# Patient Record
Sex: Male | Born: 1960 | Race: White | Hispanic: No | Smoking: Current every day smoker
Health system: Southern US, Community
[De-identification: ages and names within clinical notes are randomized; demographics above are authoritative.]

## PROBLEM LIST (undated history)

## (undated) DIAGNOSIS — I83003 Varicose veins of unspecified lower extremity with ulcer of ankle: Secondary | ICD-10-CM

## (undated) DIAGNOSIS — G43909 Migraine, unspecified, not intractable, without status migrainosus: Secondary | ICD-10-CM

## (undated) DIAGNOSIS — L97309 Non-pressure chronic ulcer of unspecified ankle with unspecified severity: Secondary | ICD-10-CM

---

## 2009-03-12 ENCOUNTER — Emergency Department (HOSPITAL_COMMUNITY): Admission: EM | Admit: 2009-03-12 | Discharge: 2009-03-12 | Payer: Self-pay | Admitting: Emergency Medicine

## 2010-08-20 LAB — POCT I-STAT, CHEM 8
Calcium, Ion: 1.23 mmol/L (ref 1.12–1.32)
Chloride: 105 mEq/L (ref 96–112)
Creatinine, Ser: 0.9 mg/dL (ref 0.4–1.5)
Glucose, Bld: 90 mg/dL (ref 70–99)
HCT: 42 % (ref 39.0–52.0)
Potassium: 4 mEq/L (ref 3.5–5.1)
Sodium: 141 mEq/L (ref 135–145)

## 2012-05-17 DIAGNOSIS — I83003 Varicose veins of unspecified lower extremity with ulcer of ankle: Secondary | ICD-10-CM

## 2012-05-17 DIAGNOSIS — L97309 Non-pressure chronic ulcer of unspecified ankle with unspecified severity: Secondary | ICD-10-CM

## 2012-05-17 HISTORY — DX: Varicose veins of unspecified lower extremity with ulcer of ankle: I83.003

## 2012-05-17 HISTORY — DX: Varicose veins of unspecified lower extremity with ulcer of ankle: L97.309

## 2013-10-29 ENCOUNTER — Other Ambulatory Visit: Payer: Self-pay | Admitting: Family Medicine

## 2013-10-29 DIAGNOSIS — L97329 Non-pressure chronic ulcer of left ankle with unspecified severity: Secondary | ICD-10-CM

## 2013-11-01 ENCOUNTER — Other Ambulatory Visit: Payer: Self-pay

## 2013-11-02 ENCOUNTER — Ambulatory Visit
Admission: RE | Admit: 2013-11-02 | Discharge: 2013-11-02 | Disposition: A | Discharge status: Home / Self Care | Payer: BC Managed Care – PPO | Source: Ambulatory Visit | Attending: Family Medicine | Admitting: Family Medicine

## 2013-11-02 DIAGNOSIS — L97329 Non-pressure chronic ulcer of left ankle with unspecified severity: Secondary | ICD-10-CM

## 2014-10-09 ENCOUNTER — Encounter (HOSPITAL_BASED_OUTPATIENT_CLINIC_OR_DEPARTMENT_OTHER): Payer: BLUE CROSS/BLUE SHIELD | Attending: Surgery

## 2014-10-09 DIAGNOSIS — Z87891 Personal history of nicotine dependence: Secondary | ICD-10-CM | POA: Diagnosis not present

## 2014-10-09 DIAGNOSIS — L97321 Non-pressure chronic ulcer of left ankle limited to breakdown of skin: Secondary | ICD-10-CM | POA: Diagnosis present

## 2014-10-09 DIAGNOSIS — I83223 Varicose veins of left lower extremity with both ulcer of ankle and inflammation: Secondary | ICD-10-CM | POA: Diagnosis present

## 2014-10-09 DIAGNOSIS — F1021 Alcohol dependence, in remission: Secondary | ICD-10-CM | POA: Insufficient documentation

## 2014-10-10 ENCOUNTER — Other Ambulatory Visit: Payer: Self-pay | Admitting: Surgery

## 2014-10-10 DIAGNOSIS — L97322 Non-pressure chronic ulcer of left ankle with fat layer exposed: Secondary | ICD-10-CM

## 2014-10-10 DIAGNOSIS — I83223 Varicose veins of left lower extremity with both ulcer of ankle and inflammation: Secondary | ICD-10-CM

## 2014-10-10 DIAGNOSIS — L97329 Non-pressure chronic ulcer of left ankle with unspecified severity: Principal | ICD-10-CM

## 2014-10-11 ENCOUNTER — Other Ambulatory Visit: Payer: Self-pay | Admitting: Surgery

## 2014-10-11 DIAGNOSIS — L97322 Non-pressure chronic ulcer of left ankle with fat layer exposed: Secondary | ICD-10-CM

## 2014-10-11 DIAGNOSIS — I872 Venous insufficiency (chronic) (peripheral): Secondary | ICD-10-CM

## 2014-10-11 DIAGNOSIS — L97329 Non-pressure chronic ulcer of left ankle with unspecified severity: Principal | ICD-10-CM

## 2014-10-11 DIAGNOSIS — I83223 Varicose veins of left lower extremity with both ulcer of ankle and inflammation: Secondary | ICD-10-CM

## 2014-10-15 ENCOUNTER — Other Ambulatory Visit: Payer: Self-pay | Admitting: Surgery

## 2014-10-15 ENCOUNTER — Ambulatory Visit
Admission: RE | Admit: 2014-10-15 | Discharge: 2014-10-15 | Disposition: A | Discharge status: Home / Self Care | Payer: BLUE CROSS/BLUE SHIELD | Source: Ambulatory Visit | Attending: Surgery | Admitting: Surgery

## 2014-10-15 DIAGNOSIS — I83223 Varicose veins of left lower extremity with both ulcer of ankle and inflammation: Secondary | ICD-10-CM

## 2014-10-15 DIAGNOSIS — I872 Venous insufficiency (chronic) (peripheral): Secondary | ICD-10-CM | POA: Insufficient documentation

## 2014-10-15 DIAGNOSIS — L97329 Non-pressure chronic ulcer of left ankle with unspecified severity: Principal | ICD-10-CM

## 2014-10-15 DIAGNOSIS — L97322 Non-pressure chronic ulcer of left ankle with fat layer exposed: Secondary | ICD-10-CM

## 2014-10-15 HISTORY — DX: Migraine, unspecified, not intractable, without status migrainosus: G43.909

## 2014-10-15 HISTORY — DX: Non-pressure chronic ulcer of unspecified ankle with unspecified severity: L97.309

## 2014-10-15 HISTORY — DX: Varicose veins of unspecified lower extremity with ulcer of ankle: I83.003

## 2014-10-15 NOTE — Consult Note (Addendum)
Chief Complaint: Chief Complaint  Patient presents with  . Advice Only    Venous Statsis Ulcer    Referring Physician(s): Dr Evlyn Kanner  History of Present Illness: Roberto Pearson is a 54 y.o. male with a history of a left sided medial ankle ulcer, which has been present for 2 years. He states that recently he has started receiving wound therapy at the wound center of [facility], approximately one week before his presentation.  He reports that the wound has been present for 2 years and has not healed, as he has been treating it with conservative over-the-counter therapy until recently. He denies any leg achiness, swelling, restless legs, or pain. He does report that he has had excessive bleeding in the past at the site of the ulcer. He never had a previous ulcer prior to the onset of the current ulcer.  He denies symptoms of the right lower extremity, with no swelling, venous varicosities, ulceration, or skin changes. No leg achiness or heaviness.  He denies knowledge of a diagnosis of a prior DVT or superficial venous thrombus.  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  He has only initiated compression therapy recently with the onset of his wound care.  Past Medical History  Diagnosis Date  . Venous stasis ulcer of ankle 2014    Left  . Migraines     No past surgical history on file.  Allergies: Review of patient's allergies indicates no known allergies.  Medications: Prior to Admission medications   Medication Sig Start Date End Date Taking? Authorizing Provider  citalopram (CELEXA) 10 MG tablet Take 10 mg by mouth daily.   Yes Historical Provider, MD  indomethacin (INDOCIN) 50 MG capsule Take 50 mg by mouth 2 (two) times daily with a meal.   Yes Historical Provider, MD  SUMAtriptan  (IMITREX) 100 MG tablet Take 100 mg by mouth every 2 (two) hours as needed for migraine. May repeat in 2 hours if headache persists or recurs.   Yes Historical Provider, MD     No family history on file.  History   Social History  . Marital Status: Legally Separated    Spouse Name: N/A  . Number of Children: N/A  . Years of Education: N/A   Social History Main Topics  . Smoking status: Current Every Day Smoker    Types: E-cigarettes  . Smokeless tobacco: Never Used  . Alcohol Use: No  . Drug Use: No  . Sexual Activity: Not on file   Other Topics Concern  . None   Social History Narrative  . None     Review of Systems: A 12 point ROS discussed and pertinent positives are indicated in the HPI above.  All other systems are negative.  Review of Systems  Vital Signs: BP 137/77 mmHg  Pulse 78  Temp(Src) 97.8 F (36.6 C) (Oral)  Resp 15  Ht  (1.778 m)  Wt 215 lb (97.523 kg)  BMI 30.85 kg/m2  SpO2 97%  Physical Exam   Atraumatic, normocephalic, mucous membranes moist and pink. Wearing glasses. Conjugate gaze. No scleral icterus or scleral injection. Neck is soft supple. No adenopathy. Chest is symmetric on inspiration and expiration with no labored breathing. Abdomen is soft nontender nondistended without muscle guarding or rigidity. Genitourinary deferred.  Moving all 4 extremities equally, with sensory is grossly intact. Patient is well kempt. Well-groomed. Appropriate questions and insight. Normal affect. Palpable bilateral dorsalis pedis pulses. Strong right-sided posterior tibial pulses. 3 cm venous ulcer overlying the medial malleolus of the left lower extremity. He has multiple telangiectasias in the Gaiter zone of the left lower extremity. Venous varicosities are present inferior to the wound, just above the heel pad.  Mallampati Score:  2  Imaging: [place] Venous Img Lower Unilateral Left  10/15/2014   CLINICAL DATA:  54 year old male with a history  of left lower extremity nonhealing ulcer. He has been referred for venous evaluation, for potential contributing venous insufficiency to ulcer.  EXAM: Left LOWER EXTREMITY VENOUS DOPPLER ULTRASOUND  TECHNIQUE: Gray-scale sonography with graded compression, as well as color Doppler and duplex ultrasound were performed to evaluate the lower extremity deep venous systems from the level of the common femoral vein and including the common femoral, femoral, profunda femoral, popliteal and calf veins including the posterior tibial, peroneal and gastrocnemius veins when visible. The superficial great saphenous vein was also interrogated. Spectral Doppler was utilized to evaluate flow at rest and with distal augmentation maneuvers in the common femoral, femoral and popliteal veins.  COMPARISON:  None.  FINDINGS: Contralateral Common Femoral Vein: Respiratory phasicity is normal and symmetric with the symptomatic side. No evidence of thrombus. Normal compressibility.  Common Femoral Vein: No evidence of thrombus. Normal compressibility, respiratory phasicity and response to augmentation.  Saphenofemoral Junction: No evidence of thrombus. Normal compressibility and flow on color Doppler imaging.  Profunda Femoral Vein: No evidence of thrombus. Normal compressibility and flow on color Doppler imaging.  Femoral Vein: No evidence of thrombus. Normal compressibility, respiratory phasicity and response to augmentation.  Popliteal Vein: No evidence of thrombus. Normal compressibility, respiratory phasicity and response to augmentation.  Calf Veins: No evidence of thrombus. Normal compressibility and flow on color Doppler imaging.  Superficial Great Saphenous Vein: No evidence of thrombus. Normal compressibility and flow on color Doppler imaging.  GSV at SFJ:  Diameter = 8mm, Reflux = 4.8 sec  GSV at Proximal Thigh: Diameter = 13mm, Reflux = 3.2 sec  GSV at Mid Thigh: Diameter = 9.269mm, Reflux = 3.3 sec  GSV at Distal Thigh: Diameter  = 9.540mm, Reflux = 2.7 sec  GSV at Prox Calf: Diameter = 7.716mm, Reflux = 2.8 sec  GSV at Mid Calf: Diameter = 5.144mm, Reflux = 1.6 sec  GSV at Distal Calf: Diameter = 5mm, Reflux = 0 sec  SSV at Sapheno popliteal junction: Diameter = 6mm, Reflux = 0 sec  SSV at mid calf: Diameter = 8mm, Reflux = 0 sec  SSF at distal calf: Diameter = 6mm, Reflux = 0 sec  Varicosities:  Several varicosities are present.  Network of varicose veins associated with the below knee great saphenous vein, proximal calf. Varicosity extends anteriorly towards the knee, as well as posteriorly towards the posterior thigh. There is associated perforator vein at the posterior medial left thigh associated with this varicose system.  Inferior network of varicose veins associated with the below knee great saphenous vein, distal calf. These varicosities extend posteriorly coursing towards the venous ulcer of the medial left ankle.  Other Findings:  None.  IMPRESSION: Sonographic survey of the left lower extremity negative for DVT.  Venous reflux (as severe as grade 3) involving the length of the left great saphenous vein from the saphenous femoral junction to the distal calf, with system of varicose veins contributing to the ulcer  at the medial left ankle.  Signed,  Yvone Neu. Loreta Ave, DO  [facility]  [facility]   Electronically Signed   By: Gilmer Mor D.O.   On: 10/15/2014 15:58    Labs:  CBC: No results for input(s): WBC, HGB, HCT, PLT in the last 8760 hours.  COAGS: No results for input(s): INR, APTT in the last 8760 hours.  BMP: No results for input(s): NA, K, CL, CO2, GLUCOSE, BUN, CALCIUM, CREATININE, GFRNONAA, GFRAA in the last 8760 hours.  Invalid input(s): CMP  LIVER FUNCTION TESTS: No results for input(s): BILITOT, AST, ALT, ALKPHOS, PROT, ALBUMIN in the last 8760 hours.  TUMOR MARKERS: No results for input(s): AFPTM, CEA, CA199, CHROMGRNA in the last 8760  hours.  Assessment and Plan:  Mr. Umbach is a 54 year old gentleman with CEAP 6 category venous insufficiency of his left lower extremity contributing to a nonhealing wound of approximately 2 years overlying the left medial malleolus. Directed duplex on today's examination demonstrates reflux along the length of the left great saphenous vein.  I believe Mr. Brainerd is a candidate for endovenous ablation of the left great saphenous vein to assist with healing of his venous ulcer, and I had an extensive conversation with Mr. Boutwell regarding venous disease. I discussed the natural history, pathology, pathophysiology, and potential treatment options for venous insufficiency. I had a complete informed consent regarding endovenous ablation, including risks and benefits. Specific risks that I spoke of included bleeding, infection, injury to local structures including superficial nerves, skin changes, DVT, need for further treatment/surgery/intervention, cardiopulmonary collapse, death. I answered all of his questions to the best of my ability.  I believe the best treatment would include endovenous ablation of the length of the great saphenous vein, with access near the medial malleolus where the great saphenous vein remains at least 3 mm in diameter. There is a system of varicosities associated with the wound, which should thrombosis after treatment of the great saphenous vein. If there is persisting varicosities after GSP treatment, targeted sclerotherapy may be an option.  There is a second network of venous varicosities at the proximal medial calf, which have a high likelihood of thrombosing after treatment of the great saphenous vein. Again, if this network does not resolve after endovenous ablation, directed sclerotherapy may be an option.  Mr. Beckerman wishes to proceed with the treatment at the earliest time. We can schedule him at his convenience.  Would recommend continuing wound care at this time, as well as  compression stockings if a UNA boot is not applied.  Thank you for this interesting consult.  I greatly enjoyed meeting Jashun Puertas Liwanag and look forward to participating in his care.  SignedGilmer Mor 10/15/2014, 5:52 PM   I spent a total of  45 Minutes   in face to face in clinical consultation, greater than 50% of which was counseling/coordinating care for chronic venous insufficiency contributing to nonhealing venous wound of the left lower extremity, and endovenous ablation for treatment.   ADDENDUM: 10/16/2014  Mr. Sidell has been treating his lower extremity symptoms for years with compression stockings, wearing these during the day hours.    He has been treating his left ankle venous ulcer with conservative, over the counter wound care for 2 years, in addition to compression stockings.  He initiated his [facility] treatment 1 week before his current visit.  Over the past week, he has been using compression wrap and silver-infused products.    Signed,  Yvone Neu. Loreta Ave, DO

## 2014-10-16 ENCOUNTER — Encounter (HOSPITAL_BASED_OUTPATIENT_CLINIC_OR_DEPARTMENT_OTHER): Payer: BLUE CROSS/BLUE SHIELD | Attending: Surgery

## 2014-10-16 DIAGNOSIS — I872 Venous insufficiency (chronic) (peripheral): Secondary | ICD-10-CM | POA: Insufficient documentation

## 2014-10-16 DIAGNOSIS — Z87891 Personal history of nicotine dependence: Secondary | ICD-10-CM | POA: Diagnosis not present

## 2014-10-16 DIAGNOSIS — L97321 Non-pressure chronic ulcer of left ankle limited to breakdown of skin: Secondary | ICD-10-CM | POA: Diagnosis not present

## 2014-10-23 DIAGNOSIS — I872 Venous insufficiency (chronic) (peripheral): Secondary | ICD-10-CM | POA: Diagnosis not present

## 2014-10-30 DIAGNOSIS — I872 Venous insufficiency (chronic) (peripheral): Secondary | ICD-10-CM | POA: Diagnosis not present

## 2014-11-06 DIAGNOSIS — I872 Venous insufficiency (chronic) (peripheral): Secondary | ICD-10-CM | POA: Diagnosis not present

## 2014-11-14 ENCOUNTER — Other Ambulatory Visit: Payer: Self-pay | Admitting: Interventional Radiology

## 2014-11-14 ENCOUNTER — Other Ambulatory Visit: Payer: Self-pay | Admitting: Surgery

## 2014-11-14 DIAGNOSIS — L97329 Non-pressure chronic ulcer of left ankle with unspecified severity: Secondary | ICD-10-CM

## 2014-11-14 DIAGNOSIS — I83223 Varicose veins of left lower extremity with both ulcer of ankle and inflammation: Secondary | ICD-10-CM

## 2014-11-14 DIAGNOSIS — L97322 Non-pressure chronic ulcer of left ankle with fat layer exposed: Secondary | ICD-10-CM

## 2014-11-14 DIAGNOSIS — I872 Venous insufficiency (chronic) (peripheral): Secondary | ICD-10-CM

## 2014-12-12 ENCOUNTER — Other Ambulatory Visit (HOSPITAL_COMMUNITY): Payer: Self-pay | Admitting: Interventional Radiology

## 2014-12-12 ENCOUNTER — Ambulatory Visit
Admission: RE | Admit: 2014-12-12 | Discharge: 2014-12-12 | Disposition: A | Discharge status: Home / Self Care | Payer: BLUE CROSS/BLUE SHIELD | Source: Ambulatory Visit | Attending: Interventional Radiology | Admitting: Interventional Radiology

## 2014-12-12 ENCOUNTER — Other Ambulatory Visit: Payer: Self-pay | Admitting: Interventional Radiology

## 2014-12-12 DIAGNOSIS — I83223 Varicose veins of left lower extremity with both ulcer of ankle and inflammation: Secondary | ICD-10-CM

## 2014-12-12 DIAGNOSIS — I872 Venous insufficiency (chronic) (peripheral): Secondary | ICD-10-CM

## 2014-12-12 DIAGNOSIS — L97329 Non-pressure chronic ulcer of left ankle with unspecified severity: Secondary | ICD-10-CM

## 2014-12-12 DIAGNOSIS — L97322 Non-pressure chronic ulcer of left ankle with fat layer exposed: Secondary | ICD-10-CM

## 2014-12-31 ENCOUNTER — Other Ambulatory Visit: Payer: BLUE CROSS/BLUE SHIELD

## 2015-01-02 ENCOUNTER — Other Ambulatory Visit (HOSPITAL_COMMUNITY): Payer: Self-pay | Admitting: Interventional Radiology

## 2015-01-02 ENCOUNTER — Ambulatory Visit: Admission: RE | Admit: 2015-01-02 | Payer: BLUE CROSS/BLUE SHIELD | Source: Ambulatory Visit

## 2015-01-02 ENCOUNTER — Encounter: Payer: Self-pay | Admitting: Radiology

## 2015-01-02 ENCOUNTER — Ambulatory Visit
Admission: RE | Admit: 2015-01-02 | Discharge: 2015-01-02 | Disposition: A | Discharge status: Home / Self Care | Payer: BLUE CROSS/BLUE SHIELD | Source: Ambulatory Visit | Attending: Interventional Radiology | Admitting: Interventional Radiology

## 2015-01-02 DIAGNOSIS — I872 Venous insufficiency (chronic) (peripheral): Secondary | ICD-10-CM

## 2015-01-02 DIAGNOSIS — L97322 Non-pressure chronic ulcer of left ankle with fat layer exposed: Secondary | ICD-10-CM

## 2015-01-02 DIAGNOSIS — L97329 Non-pressure chronic ulcer of left ankle with unspecified severity: Secondary | ICD-10-CM

## 2015-01-02 DIAGNOSIS — I83223 Varicose veins of left lower extremity with both ulcer of ankle and inflammation: Secondary | ICD-10-CM

## 2015-01-09 ENCOUNTER — Encounter: Payer: Self-pay | Admitting: Radiology

## 2015-01-09 ENCOUNTER — Ambulatory Visit
Admission: RE | Admit: 2015-01-09 | Discharge: 2015-01-09 | Disposition: A | Discharge status: Home / Self Care | Payer: BLUE CROSS/BLUE SHIELD | Source: Ambulatory Visit | Attending: Interventional Radiology | Admitting: Interventional Radiology

## 2015-01-09 DIAGNOSIS — I83223 Varicose veins of left lower extremity with both ulcer of ankle and inflammation: Secondary | ICD-10-CM

## 2015-01-09 DIAGNOSIS — L97329 Non-pressure chronic ulcer of left ankle with unspecified severity: Principal | ICD-10-CM

## 2015-01-09 DIAGNOSIS — L97322 Non-pressure chronic ulcer of left ankle with fat layer exposed: Secondary | ICD-10-CM

## 2015-01-09 DIAGNOSIS — I872 Venous insufficiency (chronic) (peripheral): Secondary | ICD-10-CM

## 2015-01-09 NOTE — Progress Notes (Signed)
Patient ID: Roberto Pearson, male   DOB: 1961/05/17, 54 y.o.   MRN: [id number]       Chief Complaint: Venous insufficiency left lower extremity, leg pain and edema  Referring Physician(s): Sanye Ledesma  History of Present Illness: Roberto Pearson is a 54 y.o. male one-week status post left GSV transcatheter laser occlusion. He has recovered at home very well. He has been compliant with compression stockings and daily walking. No interval fevers. No signs of DVT or phlebitis. Left medial ankle wound is improving. Overall he is doing very well. He states he is ready to go back to work.  Past Medical History  Diagnosis Date  . Venous stasis ulcer of ankle 2014    Left  . Migraines     No past surgical history on file.  Allergies: Review of patient's allergies indicates no known allergies.  Medications: Prior to Admission medications   Medication Sig Start Date End Date Taking? Authorizing Provider  citalopram (CELEXA) 10 MG tablet Take 10 mg by mouth daily.    Historical Provider, MD  indomethacin (INDOCIN) 50 MG capsule Take 50 mg by mouth 2 (two) times daily with a meal.    Historical Provider, MD  naproxen (NAPROSYN) 250 MG tablet Take by mouth 2 (two) times daily as needed.    Historical Provider, MD  SUMAtriptan (IMITREX) 100 MG tablet Take 100 mg by mouth every 2 (two) hours as needed for migraine. May repeat in 2 hours if headache persists or recurs.    Historical Provider, MD     No family history on file.  Social History   Social History  . Marital Status: Legally Separated    Spouse Name: N/A  . Number of Children: N/A  . Years of Education: N/A   Social History Main Topics  . Smoking status: Current Every Day Smoker    Types: E-cigarettes  . Smokeless tobacco: Never Used  . Alcohol Use: No  . Drug Use: No  . Sexual Activity: Not on file   Other Topics Concern  . Not on file   Social History Narrative  . No narrative on file     Review of Systems: A 12  point ROS discussed and pertinent positives are indicated in the HPI above.  All other systems are negative.  Review of Systems  Constitutional: Negative for fever and chills.  Respiratory: Negative for cough.   Cardiovascular: Negative for chest pain.  Gastrointestinal: Negative for abdominal pain.  Skin: Positive for color change.    Vital Signs: There were no vitals taken for this visit.  Physical Exam  Constitutional: He appears well-developed and well-nourished. No distress.  Musculoskeletal: Normal range of motion. He exhibits no edema or tenderness.  Left lower extremity entry site is well-healed. Medial ankle wound is improving. No peripheral edema. No signs of phlebitis or infection. Overall he is doing very well.  Skin: He is not diaphoretic.     Imaging: Ir Embo Venous Not Hemorr Hemang  Inc Guide Roadmapping  01/02/2015   CLINICAL DATA:  Symptomatic lower extremity varicose veins.  EXAM: TRANSCATHETER LASER OCCLUSION left GREATER SAPHENOUS VEIN WITH ULTRASOUND GUIDANCE:  TECHNIQUE: Survey ultrasound of the leg was performed and the course of the greater saphenous vein was marked on the skin. Overlying skin prepped with Betadine, draped in usual sterile fashion. After local anesthetic using 1% lidocaine, the greater saphenous vein was accessed at the location level under ultrasound with a 21-gauge micropuncture needle. A 018 guidewire advanced easily. This  was exchanged using a transitional dilator for a 035 J wire. Over this, the 6 French delivery sheath was advanced beyond the saphenofemoral junction. The laser fiber was advanced and positioned 2.0 cm from the saphenofemoral junction. Tumescent dilute 0.1% lidocaine used along the planned treatment length. Ultrasound was used to confirm appropriate tumescent anesthesia and to verify that all segments were at least 1 cm from the skin surface. The laser fiber was activated while being withdrawn over the treatment length,  delivering 2,308joules over 289seconds. The catheter and sheath were removed and hemostasis easily achieved at the site. Patient was placed in graduated compression stockings and ambulated for 20 minutes without difficulty. Patient tolerated the procedure well, with no immediate complication.  COMPLICATIONS: None  IMPRESSION: 1. Technically successful transcatheter laser occlusion of left greater saphenous vein. Patient will followup in clinic in one week. Patient knows to telephone should there be any interval questions or problems.   Electronically Signed   By: Jolaine Click M.D.   On: 01/02/2015 15:20     Assessment and Plan:  One-week status post left GSV transcatheter laser occlusion. Patient has recovered at home very well. No interval fevers or pain. Entry site well-healed. Medial ankle wound is improving.  he has been compliant with compression stockings and daily walking  Plan: Follow-up outpatient visit with ultrasound in one month.  Thank you for this interesting consult.  I greatly enjoyed meeting Roberto Pearson and look forward to participating in their care.  A copy of this report was sent to the requesting provider on this date.  SignedBerdine Dance 01/09/2015, 2:31 PM   I spent a total of    15 Minutes in face to face in clinical consultation, greater than 50% of which was counseling/coordinating care for this patient status post left GSV transcatheter laser occlusion for venous insufficiency.

## 2015-02-05 ENCOUNTER — Ambulatory Visit
Admission: RE | Admit: 2015-02-05 | Discharge: 2015-02-05 | Disposition: A | Discharge status: Home / Self Care | Payer: BLUE CROSS/BLUE SHIELD | Source: Ambulatory Visit | Attending: Interventional Radiology | Admitting: Interventional Radiology

## 2015-02-05 DIAGNOSIS — L97329 Non-pressure chronic ulcer of left ankle with unspecified severity: Principal | ICD-10-CM

## 2015-02-05 DIAGNOSIS — L97322 Non-pressure chronic ulcer of left ankle with fat layer exposed: Secondary | ICD-10-CM

## 2015-02-05 DIAGNOSIS — I83223 Varicose veins of left lower extremity with both ulcer of ankle and inflammation: Secondary | ICD-10-CM

## 2015-02-05 DIAGNOSIS — I872 Venous insufficiency (chronic) (peripheral): Secondary | ICD-10-CM

## 2015-02-06 ENCOUNTER — Other Ambulatory Visit: Payer: BLUE CROSS/BLUE SHIELD

## 2015-02-06 ENCOUNTER — Ambulatory Visit: Payer: BLUE CROSS/BLUE SHIELD

## 2015-09-18 ENCOUNTER — Other Ambulatory Visit (HOSPITAL_COMMUNITY): Payer: Self-pay | Admitting: Interventional Radiology

## 2015-09-18 DIAGNOSIS — I872 Venous insufficiency (chronic) (peripheral): Secondary | ICD-10-CM

## 2015-09-18 DIAGNOSIS — L97322 Non-pressure chronic ulcer of left ankle with fat layer exposed: Secondary | ICD-10-CM

## 2015-09-18 DIAGNOSIS — I83223 Varicose veins of left lower extremity with both ulcer of ankle and inflammation: Secondary | ICD-10-CM

## 2015-09-18 DIAGNOSIS — L97329 Non-pressure chronic ulcer of left ankle with unspecified severity: Principal | ICD-10-CM

## 2016-06-18 ENCOUNTER — Ambulatory Visit (INDEPENDENT_AMBULATORY_CARE_PROVIDER_SITE_OTHER): Payer: BLUE CROSS/BLUE SHIELD | Admitting: Orthopedic Surgery

## 2016-06-18 ENCOUNTER — Encounter (INDEPENDENT_AMBULATORY_CARE_PROVIDER_SITE_OTHER): Payer: Self-pay | Admitting: Orthopedic Surgery

## 2016-06-18 ENCOUNTER — Telehealth (INDEPENDENT_AMBULATORY_CARE_PROVIDER_SITE_OTHER): Payer: Self-pay | Admitting: Orthopedic Surgery

## 2016-06-18 ENCOUNTER — Ambulatory Visit (INDEPENDENT_AMBULATORY_CARE_PROVIDER_SITE_OTHER): Payer: BLUE CROSS/BLUE SHIELD

## 2016-06-18 DIAGNOSIS — M25511 Pain in right shoulder: Principal | ICD-10-CM

## 2016-06-18 DIAGNOSIS — G8929 Other chronic pain: Secondary | ICD-10-CM | POA: Insufficient documentation

## 2016-06-18 MED ORDER — DICLOFENAC SODIUM 2 % TD SOLN: 2.0000 | Freq: Two times a day (BID) | TRANSDERMAL | 1 refills | Status: DC

## 2016-06-18 NOTE — Telephone Encounter (Signed)
Patient called and wanted to know if Dr. August Saucerean was going to prescribe an topical ointment for his shoulder or did he need to get something over the counter? YN#[phone]#[phone].  Thank you

## 2016-06-21 ENCOUNTER — Telehealth (INDEPENDENT_AMBULATORY_CARE_PROVIDER_SITE_OTHER): Payer: Self-pay | Admitting: Orthopedic Surgery

## 2016-06-21 ENCOUNTER — Other Ambulatory Visit (INDEPENDENT_AMBULATORY_CARE_PROVIDER_SITE_OTHER): Payer: Self-pay | Admitting: Radiology

## 2016-06-21 DIAGNOSIS — M25511 Pain in right shoulder: Secondary | ICD-10-CM

## 2016-06-21 DIAGNOSIS — G8929 Other chronic pain: Secondary | ICD-10-CM | POA: Diagnosis not present

## 2016-06-21 MED ORDER — LIDOCAINE HCL 1 % IJ SOLN
3.0000 mL | INTRAMUSCULAR | Status: AC | PRN
  Administered 2016-06-21: 3 mL

## 2016-06-21 MED ORDER — BUPIVACAINE HCL 0.25 % IJ SOLN
0.6600 mL | INTRAMUSCULAR | Status: AC | PRN
  Administered 2016-06-21: .66 mL via INTRA_ARTICULAR

## 2016-06-21 MED ORDER — DICLOFENAC SODIUM 2 % TD SOLN: 2.0000 | Freq: Two times a day (BID) | TRANSDERMAL | 1 refills | Status: DC

## 2016-06-21 MED ORDER — TRIAMCINOLONE ACETONIDE 40 MG/ML IJ SUSP
20.0000 mg | INTRAMUSCULAR | Status: AC | PRN
  Administered 2016-06-21: 20 mg via INTRA_ARTICULAR

## 2016-06-21 NOTE — Telephone Encounter (Signed)
IC pharm and advised cancel Rx, not sure how it went to them, IC patient and advised also again that it will come from mail order pharm, and they will be calling him

## 2016-06-21 NOTE — Progress Notes (Signed)
Office Visit Note   Patient: Roberto Pearson           Date of Birth: [birth year]           MRN: [id number] Visit Date: 06/18/2016 Requested by: Blair Heys, MD [address] [place], [place] [address] PCP: Thora Lance, MD  Subjective: Chief Complaint  Patient presents with  . Right Shoulder - Pain    HPI  [name] is a 56 year old patient with right shoulder pain.  He's had pain in the posterior shoulder joint.  Now reports some anterior shoulder pain.  Pain ranges from 2-7 on the pain scale.  Some activities of daily living are limited.  Patient has full range of motion but it is painful.  Last shot helped when it was placed into the subacromial space.  Today he localizes his pain primarily to the superior aspect of the shoulder which is in a different location was last time.              Review of Systems All systems reviewed are negative as they relate to the chief complaint within the history of present illness.  Patient denies  fevers or chills.    Assessment & Plan: Visit Diagnoses:  1. 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  Plan is Pennsaid has a topical along with an ultrasound-guided acromioclavicular joint injection today.  Continue with Mobic.  I will see him back as needed.  Follow-Up Instructions: Return if symptoms worsen or fail to improve.   Orders:  Orders Placed This Encounter  Procedures  . XR Shoulder Right   Meds ordered this encounter  Medications  . DISCONTD: Diclofenac Sodium (PENNSAID) 2 % SOLN    Sig: Place 2 Squirts onto the skin 2 (two) times daily.    Dispense:  1 Bottle    Refill:  1      Procedures: Medium Joint Inj Date/Time: 06/21/2016 4:01 PM Performed by: Cammy Copa Authorized by: Cammy Copa   Consent Given by:  Patient Site marked: the procedure site was marked   Timeout: prior to procedure the correct patient,  procedure, and site was verified   Indications:  Pain and diagnostic evaluation Location:  Shoulder Site:  R acromioclavicular Prep: patient was prepped and draped in usual sterile fashion   Needle Size:  25 G Needle Length:  1.5 inches Approach:  Superior Ultrasound Guided: Yes   Fluoroscopic Guidance: No   Medications:  3 mL lidocaine 1 %; 20 mg triamcinolone acetonide 40 MG/ML; 0.66 mL bupivacaine 0.25 % Aspiration Attempted: No   Patient tolerance:  Patient tolerated the procedure well with no immediate complications     Clinical Data: No additional findings.  Objective: Vital Signs: There were no vitals taken for this visit.  Physical Exam   Constitutional: Patient appears well-developed HEENT:  Head: Normocephalic Eyes:EOM are normal Neck: Normal range of motion Cardiovascular: Normal rate Pulmonary/chest: Effort normal Neurologic: Patient is alert Skin: Skin is warm Psychiatric: Patient has normal mood and affect   Ortho Exam orthopedic exam demonstrates full active and passive range of motion of the right shoulder.  Good cervical spine range of motion with normal motor sensory function in the right arm.  Negative antigen relocation testing positive pain with crossarm adduction on the right negative on the left.  Excellent rotator cuff strength isolated infraspinatus supraspinatus and subscap muscle testing.  No scapular dyskinesia with  forward flexion no other masses lymph adenopathy or skin changes noted in the shoulder girdle region  Specialty Comments:  No specialty comments available.  Imaging: No results found.   PMFS History: Patient Active Problem List   Diagnosis Date Noted  . Chronic right shoulder pain 06/18/2016  . Skin ulcer of left ankle (HCC)   . 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 Venous insufficiency    Past Medical History:  Diagnosis Date  . Migraines   . Venous stasis ulcer of  ankle (HCC) 2014   Left    No family history on file.  No past surgical history on file. Social History   Occupational History  . Not on file.   Social History Main Topics  . Smoking status: Current Every Day Smoker    Types: E-cigarettes  . Smokeless tobacco: Never Used     Comment: vapes  . Alcohol use No  . Drug use: No  . Sexual activity: Not on file

## 2016-06-21 NOTE — Telephone Encounter (Signed)
IC patient and advised per pharmacy script did not go through correctly, I have resent and they should be calling the patient.

## 2016-06-21 NOTE — Telephone Encounter (Signed)
PATIENT CALLED SAYING THAT [facility] IS REFUSING TO PAY FOR Rx. WONDERING IF WE [facility] CB # [phone]

## 2016-06-23 ENCOUNTER — Telehealth (INDEPENDENT_AMBULATORY_CARE_PROVIDER_SITE_OTHER): Payer: Self-pay | Admitting: Orthopedic Surgery

## 2016-06-23 MED ORDER — DICLOFENAC SODIUM 2 % TD SOLN: 2.0000 | Freq: Two times a day (BID) | TRANSDERMAL | 1 refills | Status: AC

## 2016-06-23 NOTE — Telephone Encounter (Signed)
I have resent Rx to them, IC and s/w Velvet, they did not have eprescribed one.  I will f/u with them in the morning- and then let pt know update.

## 2016-06-23 NOTE — Telephone Encounter (Signed)
Patient called and requested that you call him in regards to the spray you all are sending him, he has some questions.  ZO#[phone]#[phone].  Thank you.

## 2016-06-24 ENCOUNTER — Telehealth (INDEPENDENT_AMBULATORY_CARE_PROVIDER_SITE_OTHER): Payer: Self-pay | Admitting: Orthopedic Surgery

## 2016-06-24 NOTE — Telephone Encounter (Signed)
IC pt and advised [facility] and s/w AJ, he says patient should have Rx by mid next week.

## 2016-06-24 NOTE — Telephone Encounter (Signed)
Patient called and stated that the pharmacy does not have any prescriptions for him.  The pharmacy is [facility].  His Cb#[phone].

## 2016-07-22 IMAGING — US US EXTREM LOW VENOUS*L*
1 series · 13 of 24 positions shown · non-contrast
Comparison: None.

CLINICAL DATA: One week status post left GSV transcatheter laser
occlusion for venous insufficiency and chronic venous stasis



[Series 1: us extrem low venous*left* · 13 of 36 slices shown]
[im 1/36]
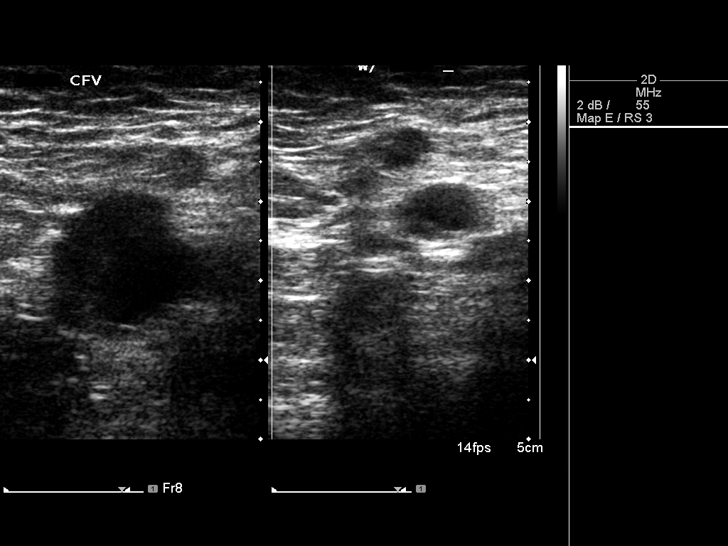
[im 4/36]
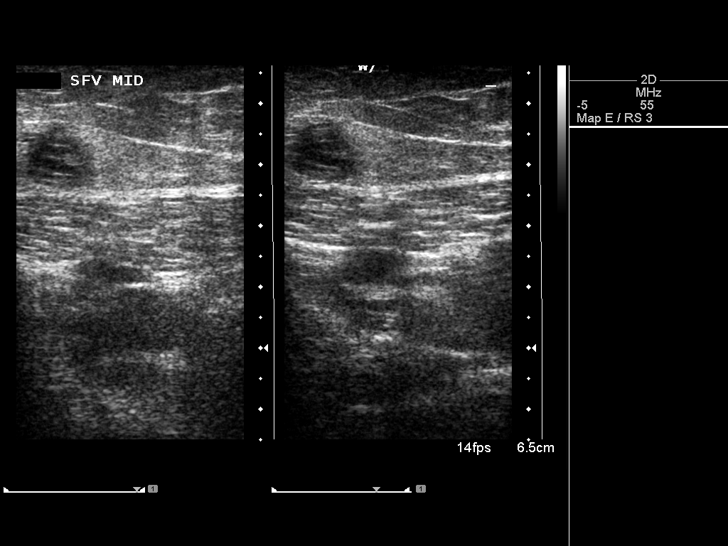
[im 7/36]
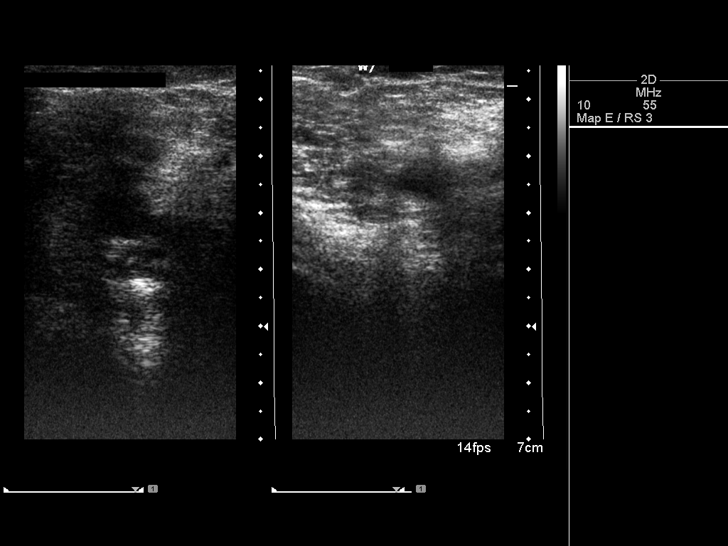
[im 10/36]
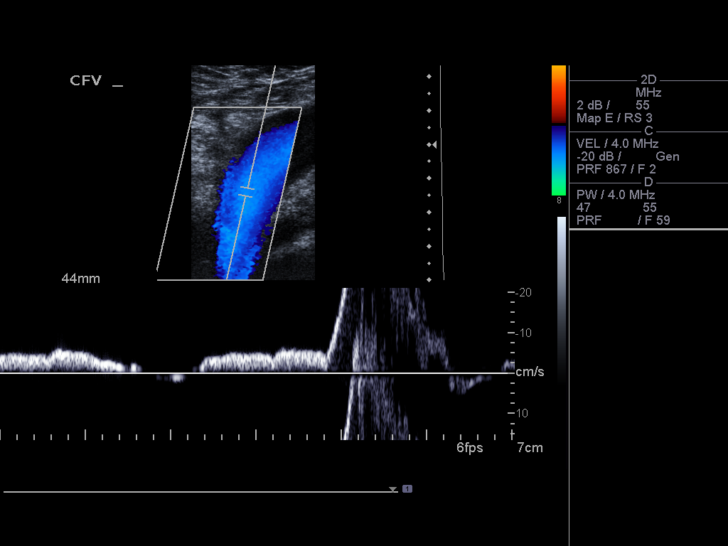
[im 13/36]
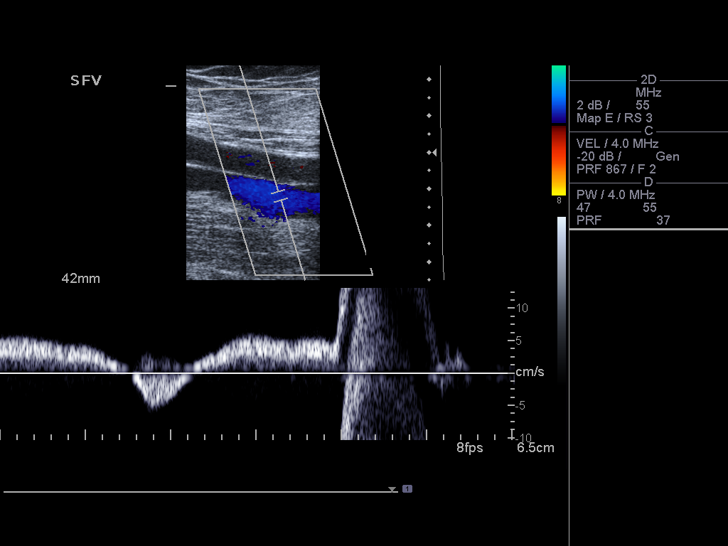
[im 16/36]
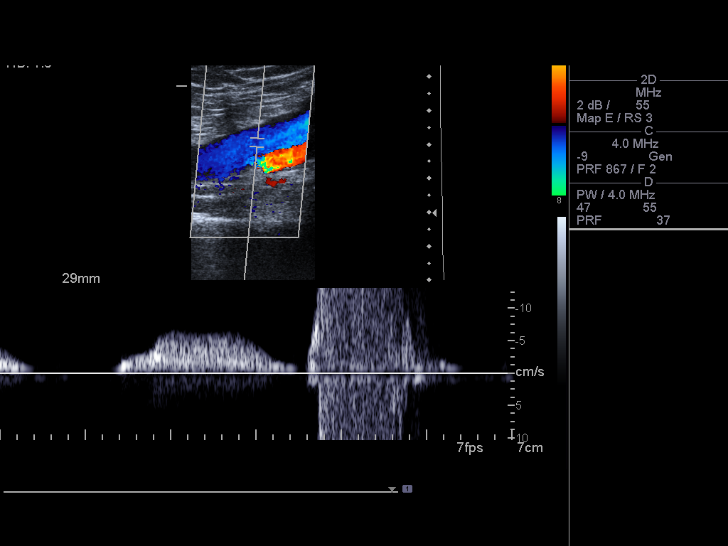
[im 19/36]
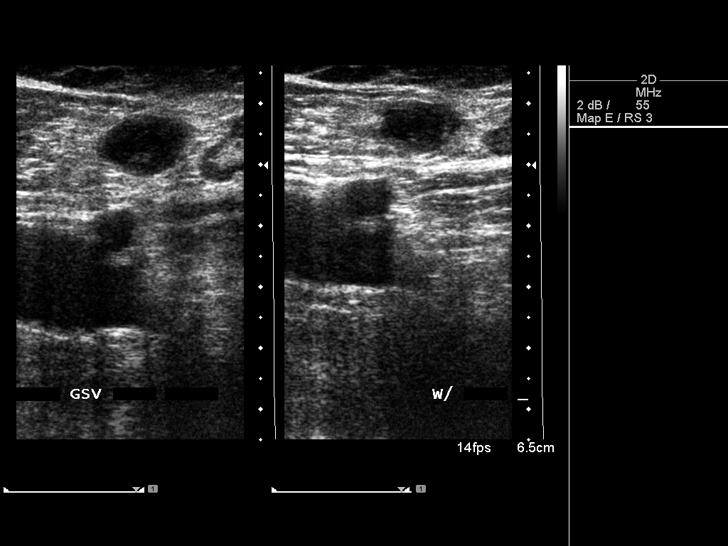
[im 20/36]
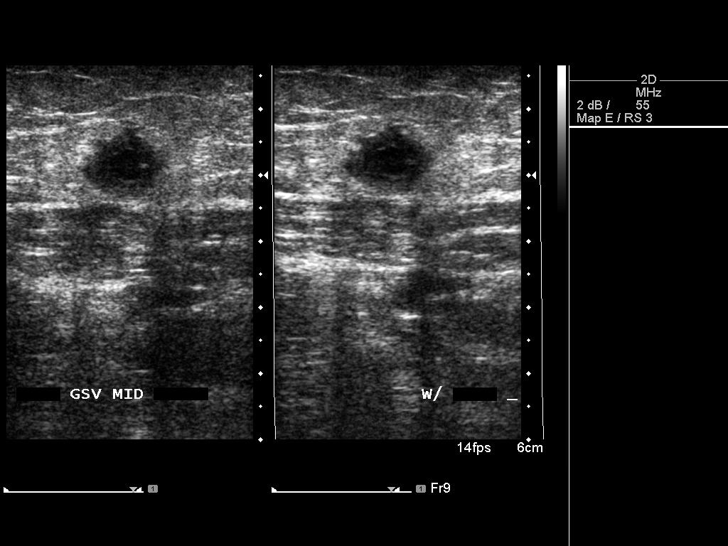
[im 23/36]
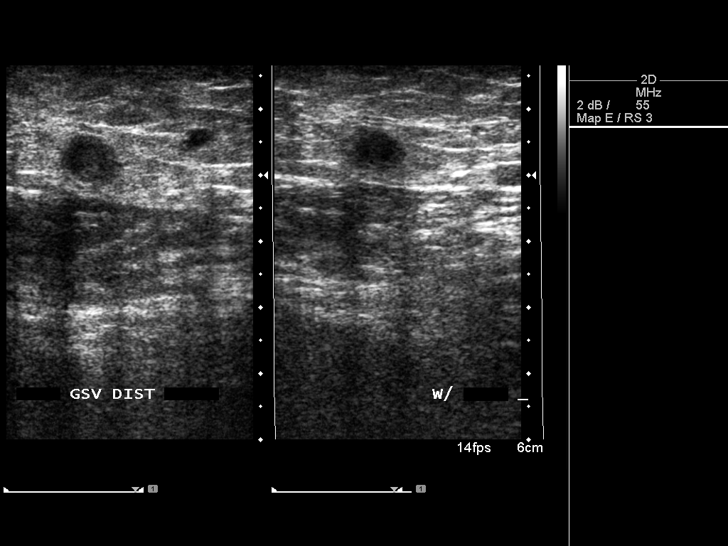
[im 26/36]
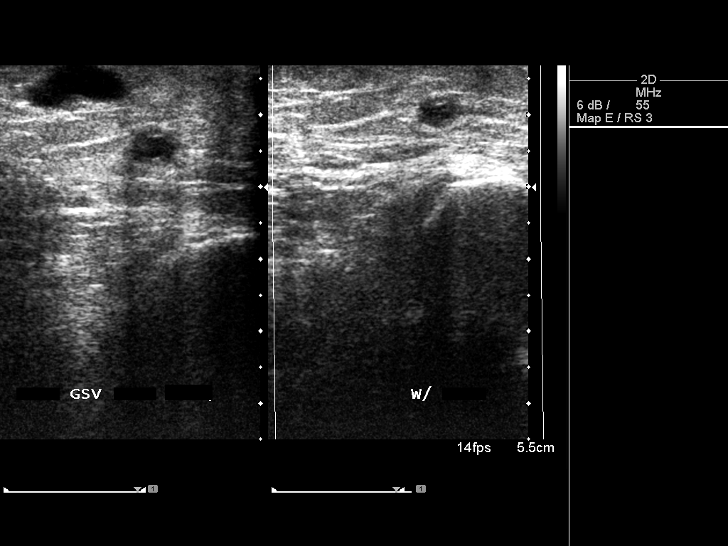
[im 29/36]
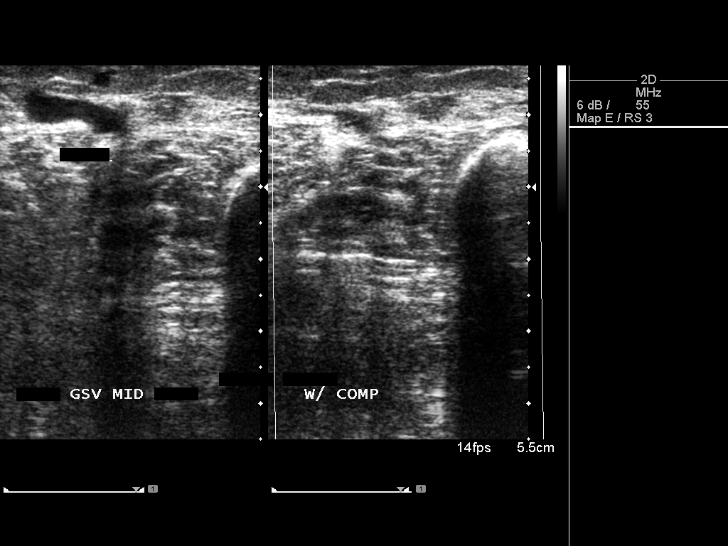
[im 32/36]
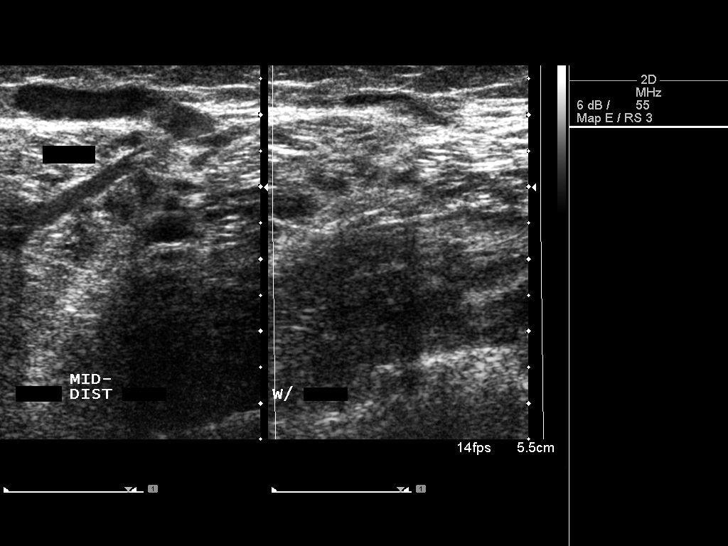
[im 36/36]
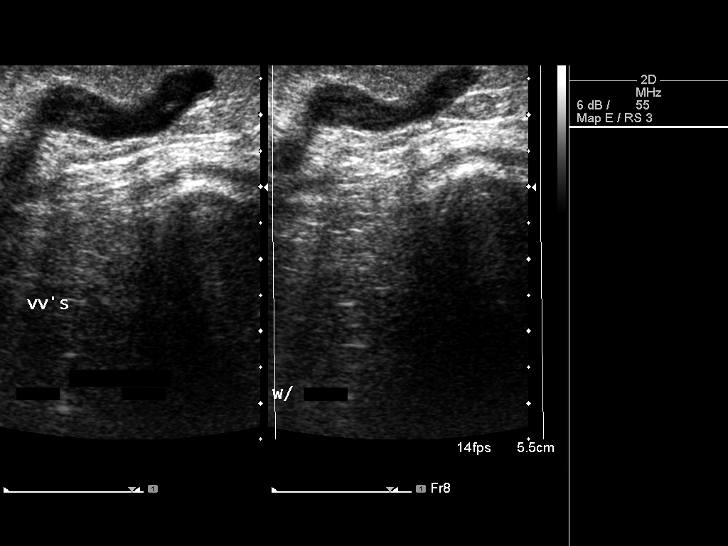

[13 of 24 positions shown; findings below may reference images not displayed]

FINDINGS: Contralateral Common Femoral Vein: Respiratory phasicity is normal
and symmetric with the symptomatic side. No evidence of thrombus.
Normal compressibility.

Common Femoral Vein: No evidence of thrombus. Normal
compressibility, respiratory phasicity and response to augmentation.

Saphenofemoral Junction: No evidence of thrombus. Normal
compressibility and flow on color Doppler imaging.

Profunda Femoral Vein: No evidence of thrombus. Normal
compressibility and flow on color Doppler imaging.

Femoral Vein: No evidence of thrombus. Normal compressibility,
respiratory phasicity and response to augmentation.

Popliteal Vein: No evidence of thrombus. Normal compressibility,
respiratory phasicity and response to augmentation.

Calf Veins: No evidence of thrombus. Normal compressibility and flow
on color Doppler imaging.

Superficial Great Saphenous Vein: Left GSV treated segment from the
saphenous femoral junction into the proximal calf demonstrates
thrombotic occlusion following laser treatment. No early
recanalization. Small proximal calf sub surface varicosities remain
patent. Perforators noted in this region.

Venous Reflux:  None.

Other Findings:  None.
IMPRESSION: Negative for DVT.

Left GSV treated segment remains occluded at 1 week post treatment.
Patient is recovering well.

Small residual patent calf varicosities noted with adjacent dilated
perforators. Consider injection sclerotherapy at the 1 month visit.

## 2016-08-18 IMAGING — US US INJEC SCLEROTHERAPY SINGLE
1 series · 14 of 16 positions shown · non-contrast
Comparison: None

CLINICAL DATA: Persistent symptomatic varicose veins of the in the
medial aspect of the proximal and mid calf despite technically
successful laser closure of the refluxing greater saphenous vein.

EXAM:
ULTRASOUND INJECTION SCLEROTHERAPHY MULTIPLE
TECHNIQUE: Survey ultrasound of the left lower extremity was performed and
appropriate skin entry sites were marked. Sites were prepped with
chlorhexidine and under direct ultrasound guidance a foam of 2 ml
sodium tetradecyl sulfate was injected in small aliquots into the
residual patent symptomatic varicose veins until there was good
filling of the affected regions. The patient tolerated the procedure
well, with no immediate complication.

[Series 1: us injec sclerotherapy single · 55 acquisitions, 14 frames shown]
[im 1/55]
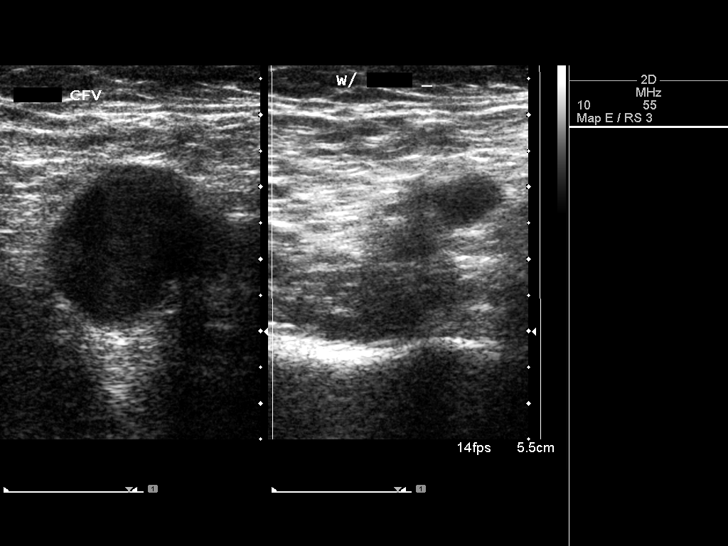
[im 4/55]
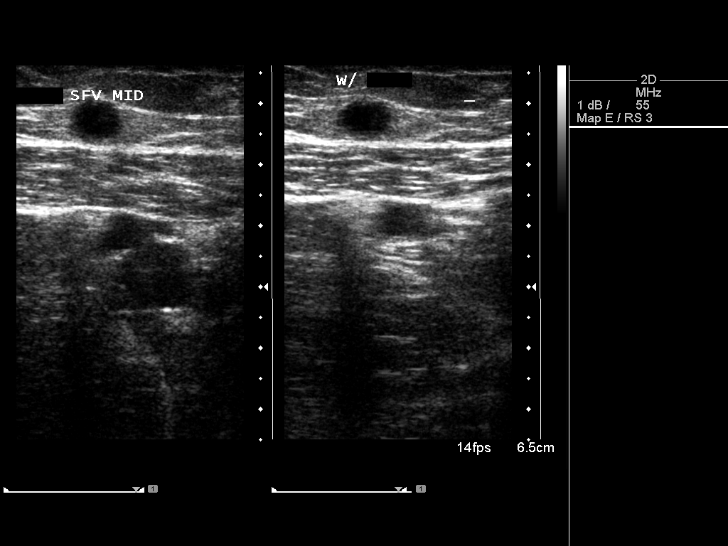
[im 8/55]
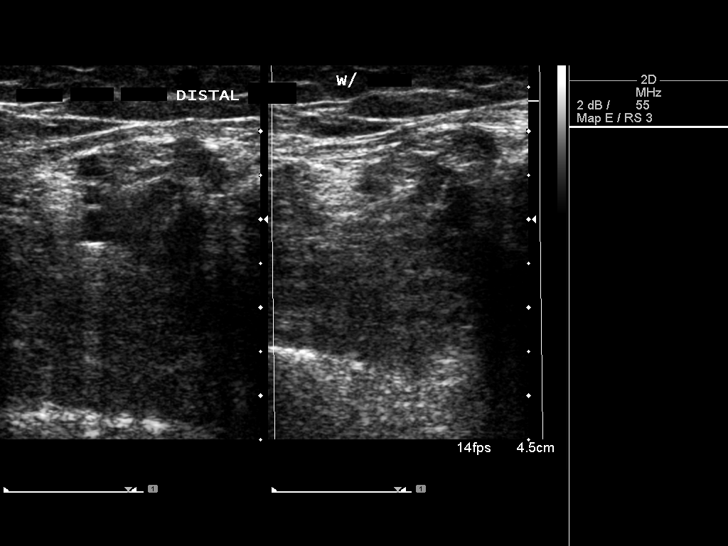
[im 15/55]
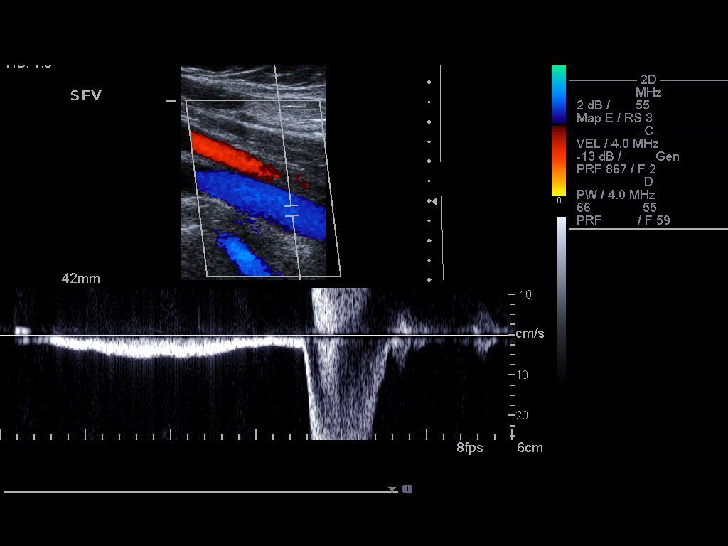
[im 19/55]
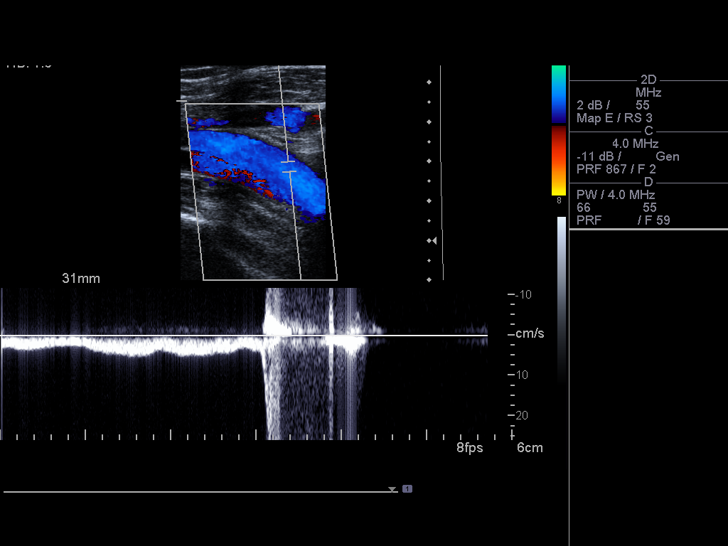
[im 22/55]
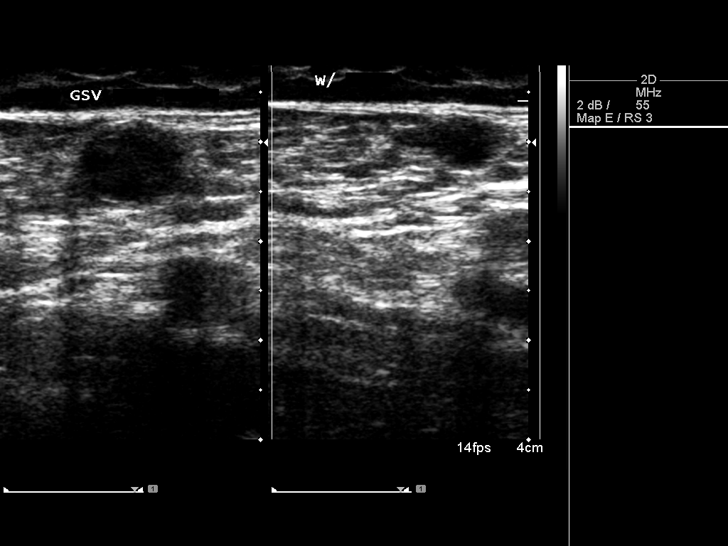
[im 26/55]
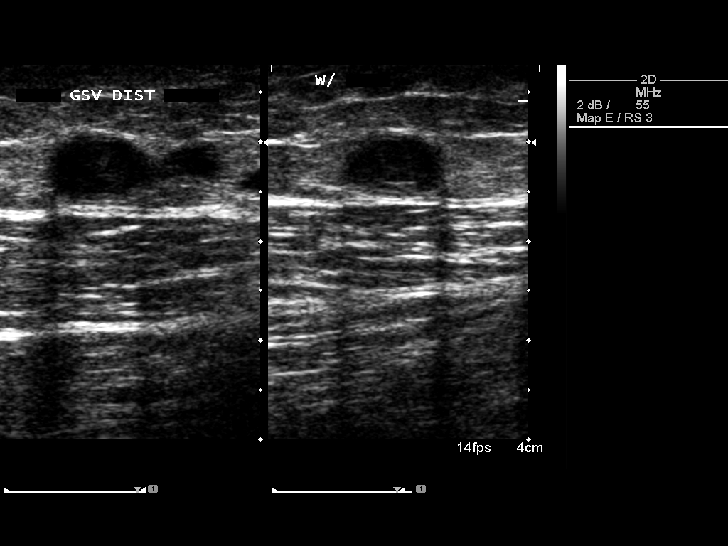
[im 29/55]
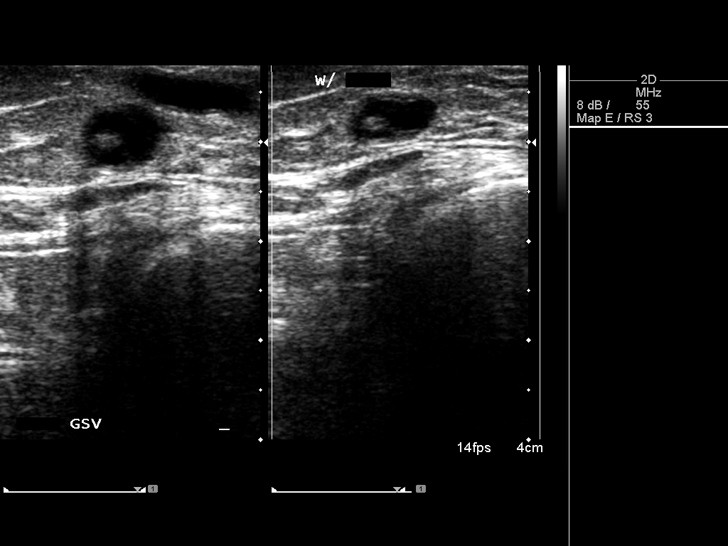
[im 33/55]
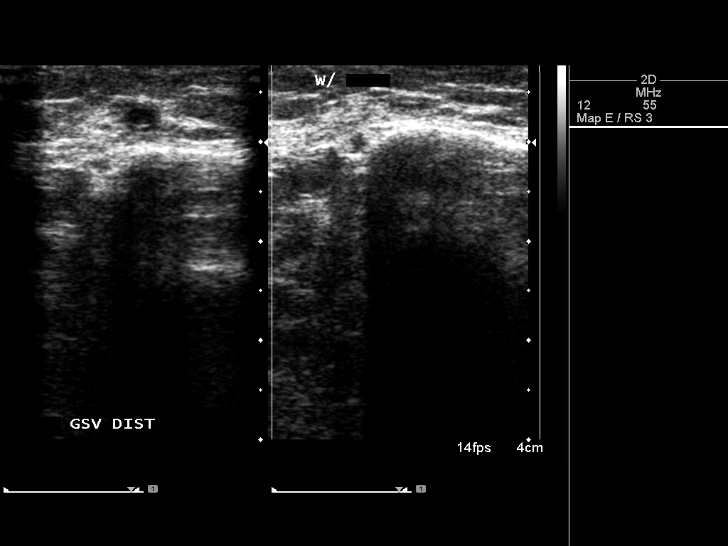
[im 37/55]
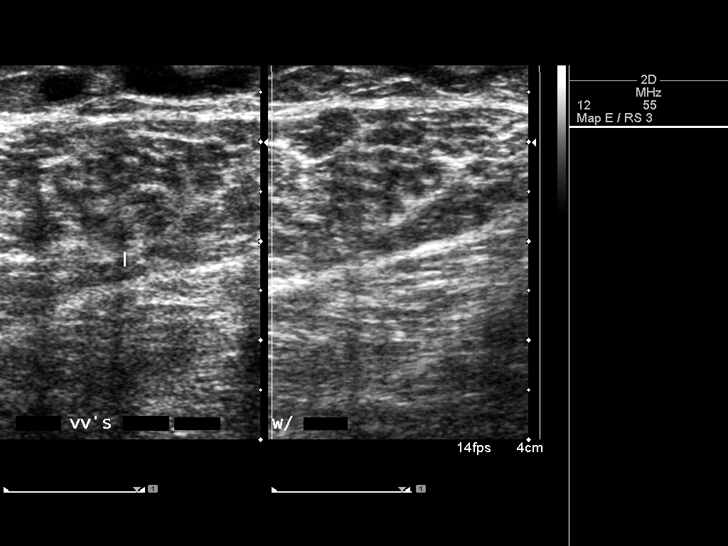
[im 44/55]
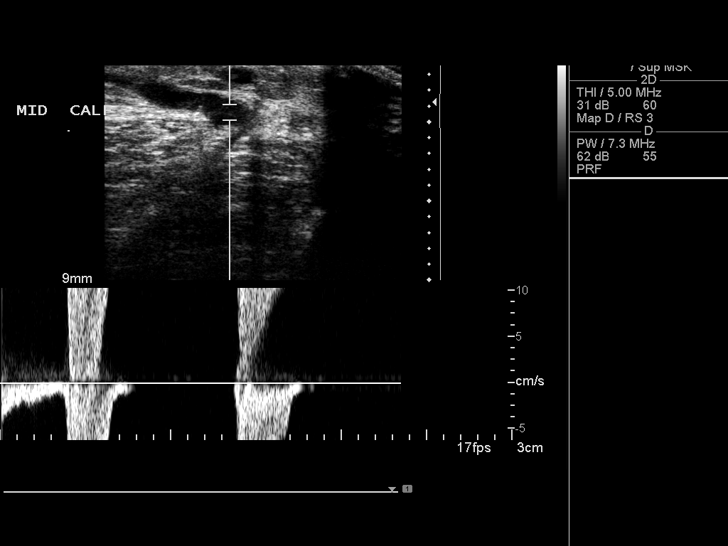
[im 47/55]
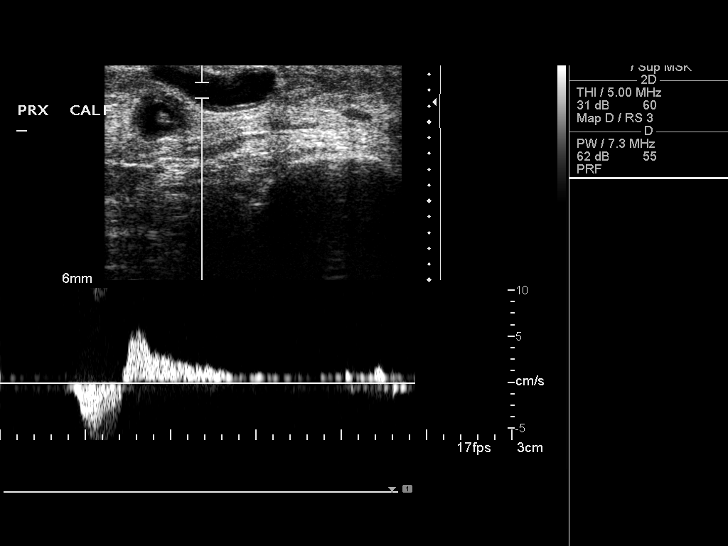
[im 51/55]
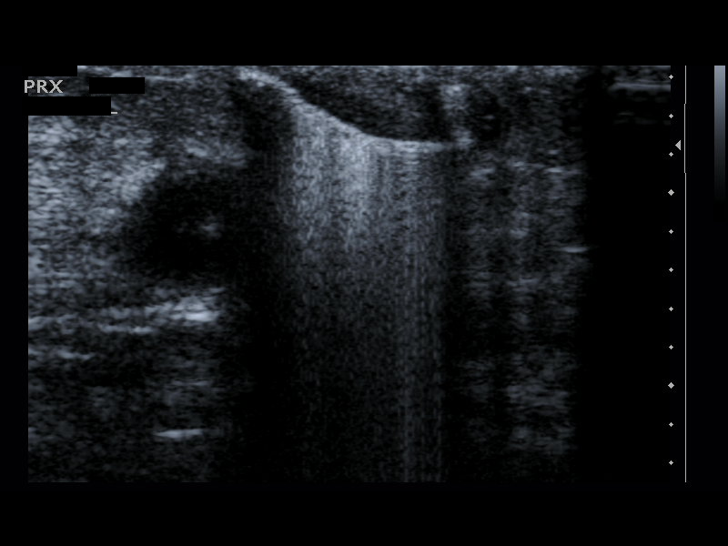
[im 55/55]
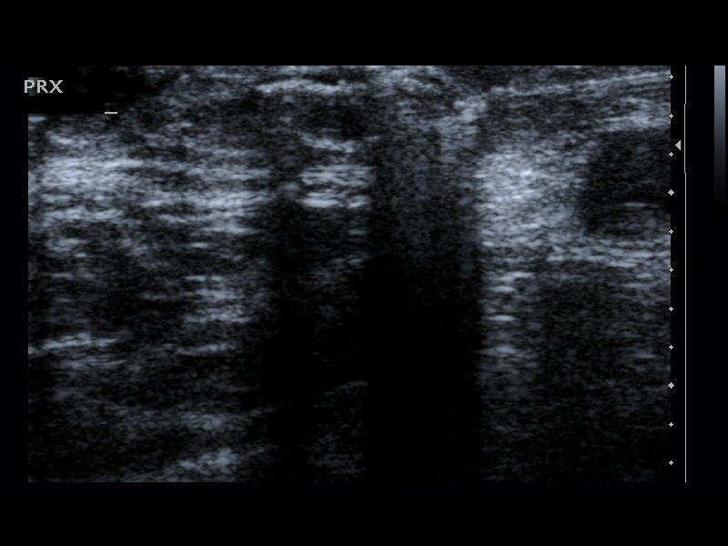

[14 of 16 positions shown; findings below may reference images not displayed]

IMPRESSION: 1. Technically successful foam sclerotherapy of residual symptomatic
left lower extremity varicose veins. Follow-up in 6 months.

## 2018-11-23 ENCOUNTER — Ambulatory Visit
Admission: RE | Admit: 2018-11-23 | Discharge: 2018-11-23 | Disposition: A | Discharge status: Home / Self Care | Payer: BLUE CROSS/BLUE SHIELD | Source: Ambulatory Visit | Attending: Family Medicine | Admitting: Family Medicine

## 2018-11-23 ENCOUNTER — Other Ambulatory Visit: Payer: Self-pay | Admitting: Family Medicine

## 2018-11-23 DIAGNOSIS — R109 Unspecified abdominal pain: Secondary | ICD-10-CM

## 2020-06-05 IMAGING — CT CT ABDOMEN AND PELVIS WITHOUT CONTRAST
2 of 4 series · 16 of 46 positions shown, 18 images · non-contrast
Comparison: None.

CLINICAL DATA: Right flank pain for 2 weeks

EXAM:
CT ABDOMEN AND PELVIS WITHOUT CONTRAST
TECHNIQUE: Multidetector CT imaging of the abdomen and pelvis was performed
following the standard protocol without IV contrast.

[Series 2: renal stone 5.00 br40 s3 axial · axial · 0.80mm/px · z∈[+1099,+1554]mm · 13 of 99 slices shown, 15 images]
[im 4/99  soft-tissue]
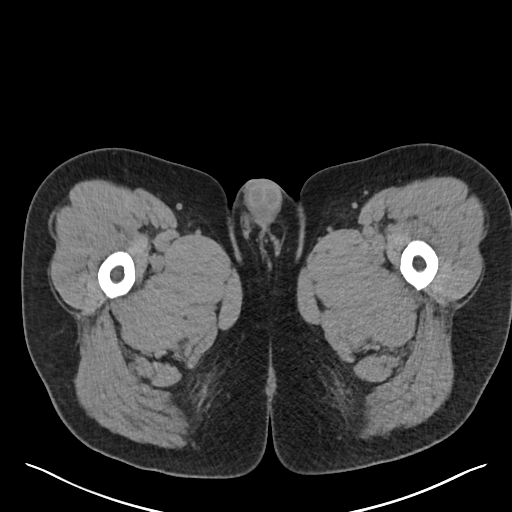
[im 4/99  bone]
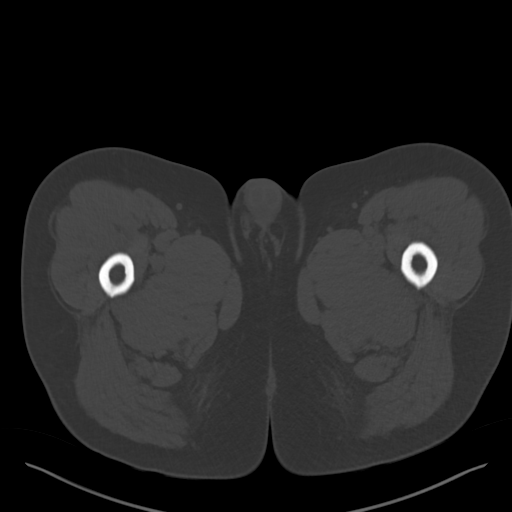
[im 12/99  soft-tissue]
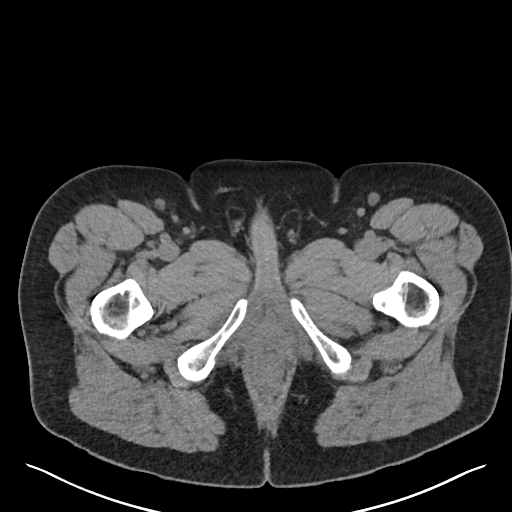
[im 20/99  soft-tissue]
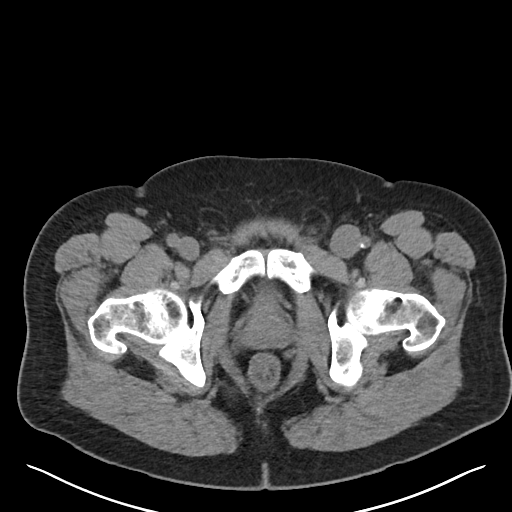
[im 28/99  soft-tissue]
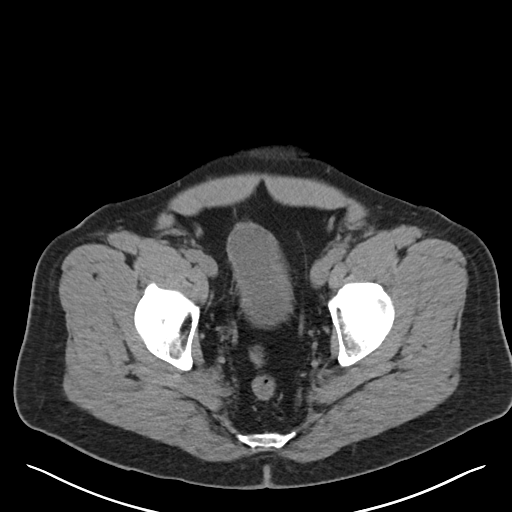
[im 36/99  soft-tissue]
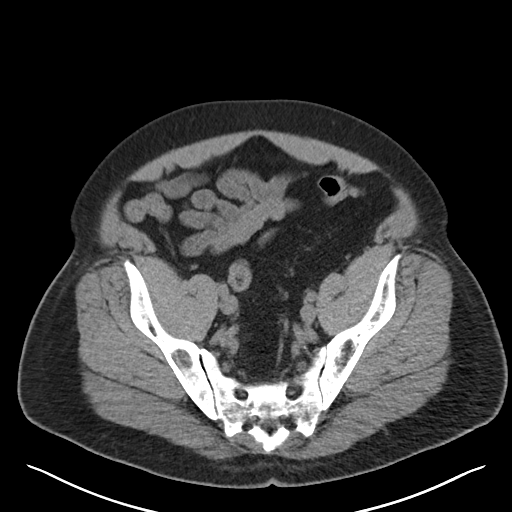
[im 44/99  soft-tissue]
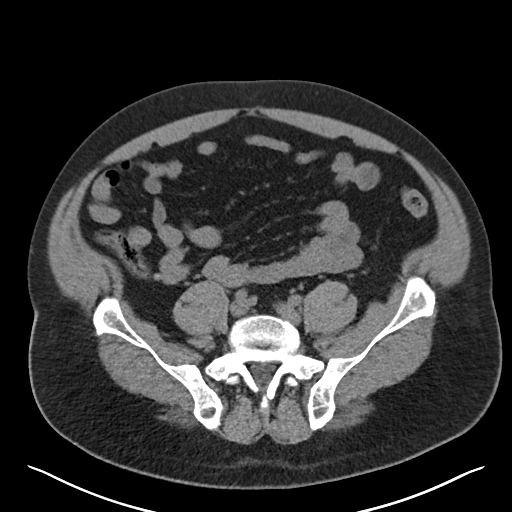
[im 51/99  soft-tissue]
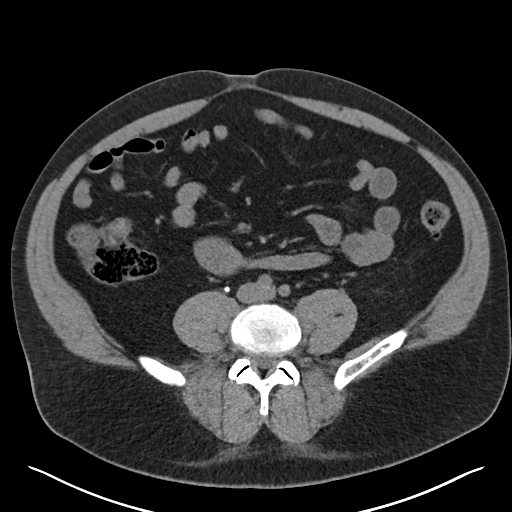
[im 55/99  soft-tissue]
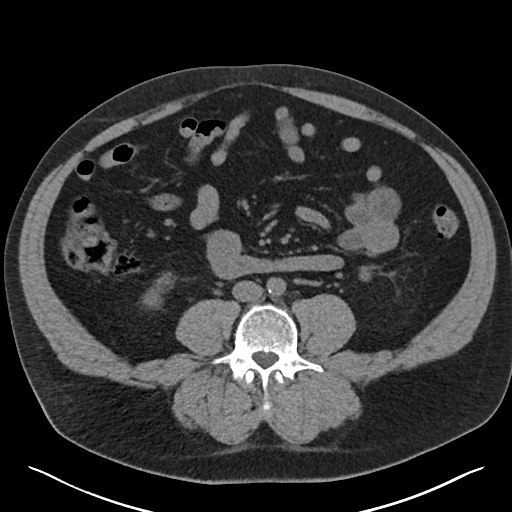
[im 63/99  soft-tissue]
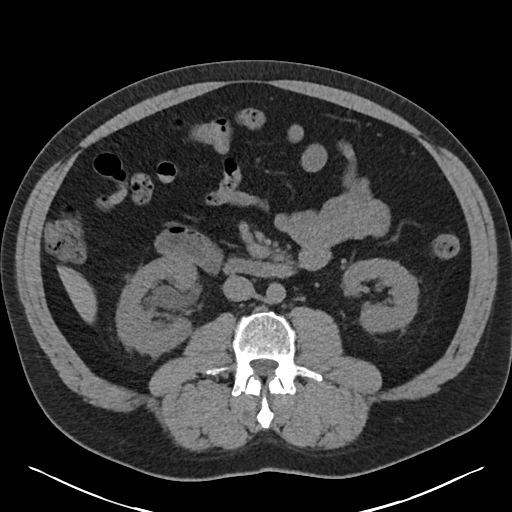
[im 63/99  bone]
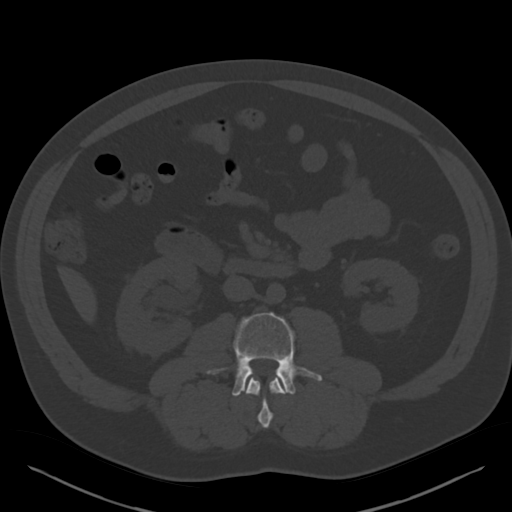
[im 71/99  soft-tissue]
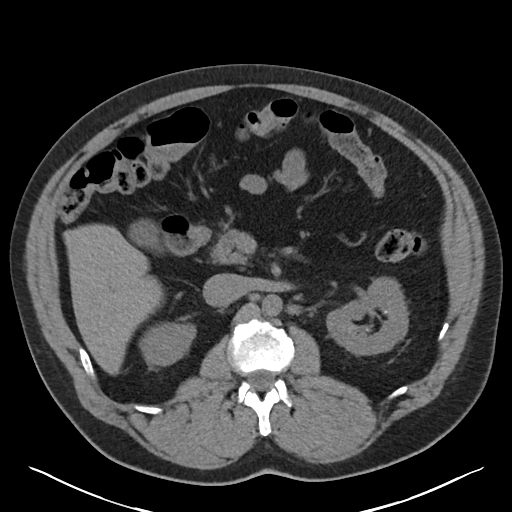
[im 79/99  soft-tissue]
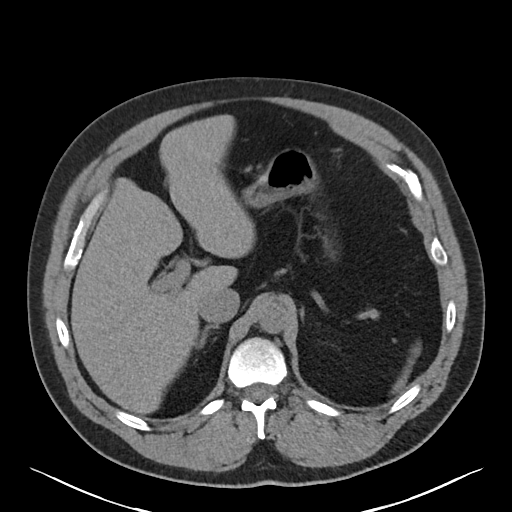
[im 87/99  soft-tissue]
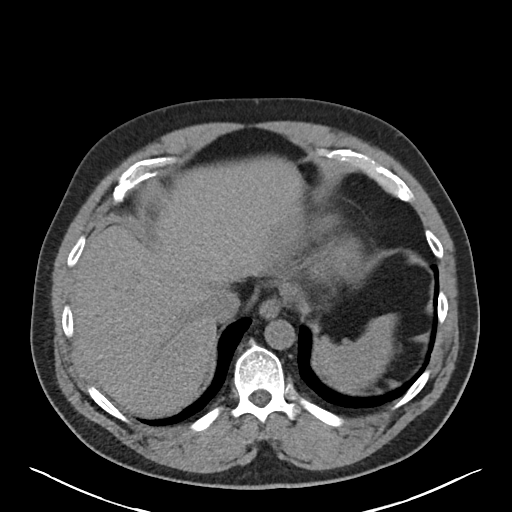
[im 95/99  soft-tissue]
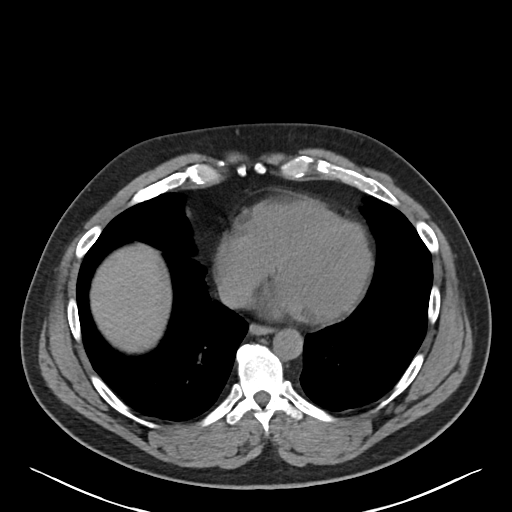

[Series 6: renal stone 2.00 br40 s3 cor · coronal · 0.80mm/px · 3 of 187 slices shown]
[im 63/187  soft-tissue]
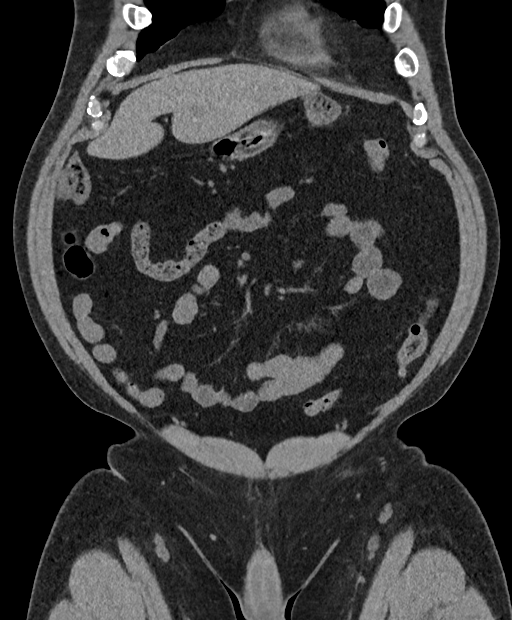
[im 83/187  soft-tissue]
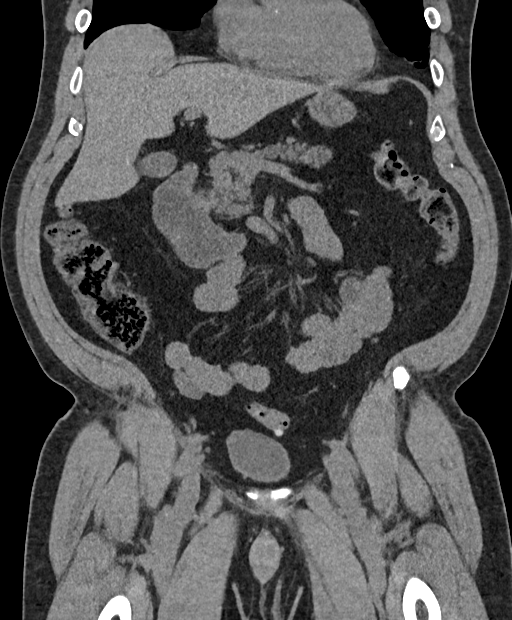
[im 104/187  soft-tissue]
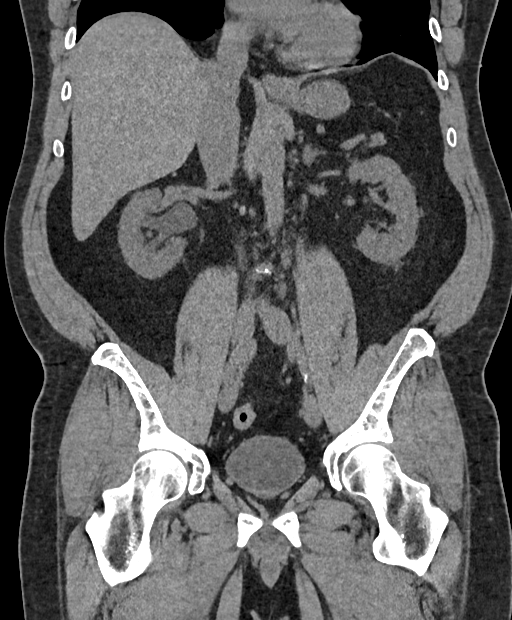

[16 of 46 positions shown; findings below may reference images not displayed]

FINDINGS: Lower chest: No acute abnormality.

Hepatobiliary: No focal liver abnormality is seen. No gallstones,
gallbladder wall thickening, or biliary dilatation.

Pancreas: Unremarkable. No pancreatic ductal dilatation or
surrounding inflammatory changes.

Spleen: Normal in size without focal abnormality.

Adrenals/Urinary Tract: Adrenal glands are within normal limits
bilaterally. The left kidney demonstrates multiple tiny calculi
without obstructive change. The left ureter is within normal limits.
The right kidney demonstrates mild hydronephrosis and proximal
hydroureter secondary to right proximal ureteral stone which
measures 5 mm. A 1 mm stone is noted just superior to this best
noted on the coronal imaging. The more distal right ureter is
unremarkable. The bladder is decompressed.

Stomach/Bowel: Scattered diverticular change of the colon is noted
without evidence of diverticulitis. The appendix is unremarkable. No
small bowel or gastric abnormality is seen.

Vascular/Lymphatic: Aortic atherosclerosis. No enlarged abdominal or
pelvic lymph nodes.

Reproductive: Prostate is unremarkable.

Other: No abdominal wall hernia or abnormality. No abdominopelvic
ascites.

Musculoskeletal: No acute or significant osseous findings.
IMPRESSION: Right proximal ureteral stone measuring 5 mm with an adjacent small
1 mm stone identified just superior to the larger stone. Mild
hydronephrosis and hydroureter are seen.

Tiny nonobstructing left renal calculi.

## 2021-09-01 DIAGNOSIS — I10 Essential (primary) hypertension: Secondary | ICD-10-CM | POA: Diagnosis not present

## 2021-09-01 DIAGNOSIS — Z125 Encounter for screening for malignant neoplasm of prostate: Secondary | ICD-10-CM | POA: Diagnosis not present

## 2021-09-01 DIAGNOSIS — G43109 Migraine with aura, not intractable, without status migrainosus: Secondary | ICD-10-CM | POA: Diagnosis not present

## 2021-09-01 DIAGNOSIS — R7303 Prediabetes: Secondary | ICD-10-CM | POA: Diagnosis not present

## 2021-09-01 DIAGNOSIS — E7800 Pure hypercholesterolemia, unspecified: Secondary | ICD-10-CM | POA: Diagnosis not present

## 2022-04-30 DIAGNOSIS — Z0000 Encounter for general adult medical examination without abnormal findings: Secondary | ICD-10-CM | POA: Diagnosis not present

## 2022-04-30 DIAGNOSIS — E7800 Pure hypercholesterolemia, unspecified: Secondary | ICD-10-CM | POA: Diagnosis not present

## 2022-04-30 DIAGNOSIS — R7303 Prediabetes: Secondary | ICD-10-CM | POA: Diagnosis not present

## 2022-04-30 DIAGNOSIS — I10 Essential (primary) hypertension: Secondary | ICD-10-CM | POA: Diagnosis not present

## 2022-12-24 DIAGNOSIS — G43109 Migraine with aura, not intractable, without status migrainosus: Secondary | ICD-10-CM | POA: Diagnosis not present

## 2022-12-24 DIAGNOSIS — I10 Essential (primary) hypertension: Secondary | ICD-10-CM | POA: Diagnosis not present

## 2022-12-24 DIAGNOSIS — E7800 Pure hypercholesterolemia, unspecified: Secondary | ICD-10-CM | POA: Diagnosis not present

## 2022-12-24 DIAGNOSIS — Z5181 Encounter for therapeutic drug level monitoring: Secondary | ICD-10-CM | POA: Diagnosis not present

## 2022-12-24 DIAGNOSIS — R7303 Prediabetes: Secondary | ICD-10-CM | POA: Diagnosis not present

## 2023-08-05 DIAGNOSIS — E782 Mixed hyperlipidemia: Secondary | ICD-10-CM | POA: Diagnosis not present

## 2023-08-05 DIAGNOSIS — Z125 Encounter for screening for malignant neoplasm of prostate: Secondary | ICD-10-CM | POA: Diagnosis not present

## 2023-08-05 DIAGNOSIS — Z0000 Encounter for general adult medical examination without abnormal findings: Secondary | ICD-10-CM | POA: Diagnosis not present

## 2023-08-05 DIAGNOSIS — Z23 Encounter for immunization: Secondary | ICD-10-CM | POA: Diagnosis not present

## 2023-08-05 DIAGNOSIS — I10 Essential (primary) hypertension: Secondary | ICD-10-CM | POA: Diagnosis not present

## 2023-08-05 DIAGNOSIS — R7303 Prediabetes: Secondary | ICD-10-CM | POA: Diagnosis not present

## 2023-08-19 DIAGNOSIS — E118 Type 2 diabetes mellitus with unspecified complications: Secondary | ICD-10-CM | POA: Diagnosis not present

## 2023-12-02 DIAGNOSIS — I10 Essential (primary) hypertension: Secondary | ICD-10-CM | POA: Diagnosis not present

## 2023-12-02 DIAGNOSIS — F411 Generalized anxiety disorder: Secondary | ICD-10-CM | POA: Diagnosis not present

## 2023-12-02 DIAGNOSIS — E118 Type 2 diabetes mellitus with unspecified complications: Secondary | ICD-10-CM | POA: Diagnosis not present

## 2024-02-17 DIAGNOSIS — I10 Essential (primary) hypertension: Secondary | ICD-10-CM | POA: Diagnosis not present

## 2024-02-17 DIAGNOSIS — E782 Mixed hyperlipidemia: Secondary | ICD-10-CM | POA: Diagnosis not present

## 2024-02-17 DIAGNOSIS — F411 Generalized anxiety disorder: Secondary | ICD-10-CM | POA: Diagnosis not present
